# Patient Record
Sex: Female | Born: 1965 | Race: White | Hispanic: No | Marital: Married | Smoking: Never smoker
Health system: Southern US, Community
[De-identification: ages and names within clinical notes are randomized; demographics above are authoritative.]

## PROBLEM LIST (undated history)

## (undated) DIAGNOSIS — R635 Abnormal weight gain: Secondary | ICD-10-CM

## (undated) DIAGNOSIS — F419 Anxiety disorder, unspecified: Secondary | ICD-10-CM

## (undated) DIAGNOSIS — K219 Gastro-esophageal reflux disease without esophagitis: Secondary | ICD-10-CM

## (undated) HISTORY — DX: Abnormal weight gain: R63.5

## (undated) HISTORY — DX: Gastro-esophageal reflux disease without esophagitis: K21.9

## (undated) HISTORY — PX: COLONOSCOPY: SHX174

## (undated) HISTORY — DX: Anxiety disorder, unspecified: F41.9

## (undated) HISTORY — PX: BLADDER SURGERY: SHX569

---

## 2005-03-19 ENCOUNTER — Ambulatory Visit: Payer: Self-pay | Admitting: Unknown Physician Specialty

## 2006-08-26 ENCOUNTER — Ambulatory Visit: Payer: Self-pay | Admitting: Unknown Physician Specialty

## 2006-09-01 ENCOUNTER — Encounter: Payer: Self-pay | Admitting: Urology

## 2006-09-11 ENCOUNTER — Encounter: Payer: Self-pay | Admitting: Urology

## 2006-10-12 ENCOUNTER — Encounter: Payer: Self-pay | Admitting: Urology

## 2006-11-11 ENCOUNTER — Encounter: Payer: Self-pay | Admitting: Urology

## 2007-09-27 ENCOUNTER — Ambulatory Visit: Payer: Self-pay | Admitting: Unknown Physician Specialty

## 2009-10-03 ENCOUNTER — Ambulatory Visit: Payer: Self-pay | Admitting: Unknown Physician Specialty

## 2010-07-25 ENCOUNTER — Emergency Department: Payer: Self-pay | Admitting: Unknown Physician Specialty

## 2016-12-02 ENCOUNTER — Telehealth: Payer: Self-pay | Admitting: Gastroenterology

## 2016-12-02 NOTE — Telephone Encounter (Signed)
Patient left a voice message that she had a colonoscopy 3 years ago with Dr. Servando SnareWohl and remembered him saying she will need a repeat in 3 years due to family history. Please double check and call patient.

## 2016-12-10 ENCOUNTER — Encounter: Payer: Self-pay | Admitting: Gastroenterology

## 2016-12-10 NOTE — Telephone Encounter (Signed)
Contacted pt and advised her that she was due for her repeat colonoscopy, but not until 04/2017. Also advised her Dr. Servando SnareWohl no longer goes to [facility] so she may want to check with her insurance and see if those restrictions to go to [facility] still apply. If so, she will need to contact her PCP for a new referral to another GI.

## 2017-08-26 ENCOUNTER — Encounter: Payer: Self-pay | Admitting: Obstetrics & Gynecology

## 2017-08-27 ENCOUNTER — Encounter: Payer: Self-pay | Admitting: Obstetrics & Gynecology

## 2017-08-27 ENCOUNTER — Ambulatory Visit (INDEPENDENT_AMBULATORY_CARE_PROVIDER_SITE_OTHER): Payer: BC Managed Care – PPO | Admitting: Obstetrics & Gynecology

## 2017-08-27 VITALS — BP 130/90 | HR 67 | Ht 67.0 in | Wt 200.0 lb

## 2017-08-27 DIAGNOSIS — Z131 Encounter for screening for diabetes mellitus: Secondary | ICD-10-CM

## 2017-08-27 DIAGNOSIS — Z1231 Encounter for screening mammogram for malignant neoplasm of breast: Secondary | ICD-10-CM | POA: Diagnosis not present

## 2017-08-27 DIAGNOSIS — Z1239 Encounter for other screening for malignant neoplasm of breast: Secondary | ICD-10-CM

## 2017-08-27 DIAGNOSIS — Z1329 Encounter for screening for other suspected endocrine disorder: Secondary | ICD-10-CM | POA: Diagnosis not present

## 2017-08-27 DIAGNOSIS — Z0000 Encounter for general adult medical examination without abnormal findings: Secondary | ICD-10-CM | POA: Diagnosis not present

## 2017-08-27 DIAGNOSIS — Z13220 Encounter for screening for lipoid disorders: Secondary | ICD-10-CM | POA: Diagnosis not present

## 2017-08-27 DIAGNOSIS — Z1321 Encounter for screening for nutritional disorder: Secondary | ICD-10-CM | POA: Diagnosis not present

## 2017-08-27 MED ORDER — PHENTERMINE HCL 37.5 MG PO TABS: 37.5000 mg | ORAL_TABLET | Freq: Every day | ORAL | 0 refills | Status: DC

## 2017-08-27 NOTE — Progress Notes (Signed)
HPI:      Ms. Lauren Harris is a 52 y.o. [id number] who LMP was in the past, she presents today for her annual examination.  The patient has no complaints today. The patient is sexually active. Herlast pap: approximate date 2017 and was normal and last mammogram: approximate date 2017 and was normal.  The patient does perform self breast exams.  There is no notable family history of breast or ovarian cancer in her family. The patient is not taking hormone replacement therapy. Patient denies post-menopausal vaginal bleeding.   The patient has regular exercise: yes. The patient denies current symptoms of depression.    GYN Hx: Last Colonoscopy:few months ago. Normal.  Last DEXA: never ago.    PMHx: Past Medical History:  Diagnosis Date  . Anxiety   . GERD (gastroesophageal reflux disease)   . Weight gain    Past Surgical History:  Procedure Laterality Date  . BLADDER SURGERY    . COLONOSCOPY     Family History  Problem Relation Age of Onset  . Leukemia Mother   . Colon cancer Brother   . Cancer Father   . Kidney failure Father    Social History   Tobacco Use  . Smoking status: Never Smoker  . Smokeless tobacco: Never Used  Substance Use Topics  . Alcohol use: No    Frequency: Never  . Drug use: No    Current Outpatient Medications:  .  esomeprazole (NEXIUM) 20 MG capsule, Take by mouth., Disp: , Rfl:  .  ibuprofen (ADVIL,MOTRIN) 200 MG tablet, Take by mouth., Disp: , Rfl:  .  phentermine (ADIPEX-P) 37.5 MG tablet, Take 1 tablet (37.5 mg total) by mouth daily before breakfast., Disp: 30 tablet, Rfl: 0 .  RABEprazole (ACIPHEX) 20 MG tablet, TK 1 T PO QD, Disp: , Rfl: 0 .  sertraline (ZOLOFT) 100 MG tablet, TK 1 T PO AS NEEDED, Disp: , Rfl: 3 Allergies: Codeine and Penicillin g  Review of Systems  Constitutional: Negative for chills, fever and malaise/fatigue.  HENT: Negative for congestion, sinus pain and sore throat.   Eyes: Negative for blurred vision and pain.    Respiratory: Negative for cough and wheezing.   Cardiovascular: Negative for chest pain and leg swelling.  Gastrointestinal: Negative for abdominal pain, constipation, diarrhea, heartburn, nausea and vomiting.  Genitourinary: Negative for dysuria, frequency, hematuria and urgency.  Musculoskeletal: Negative for back pain, joint pain, myalgias and neck pain.  Skin: Negative for itching and rash.  Neurological: Negative for dizziness, tremors and weakness.  Endo/Heme/Allergies: Does not bruise/bleed easily.  Psychiatric/Behavioral: Negative for depression. The patient is not nervous/anxious and does not have insomnia.     Objective: BP 130/90   Pulse 67   Ht 5\' 7"  (1.702 m)   Wt 200 lb (90.7 kg)   BMI 31.32 kg/m   Filed Weights   08/27/17 1435  Weight: 200 lb (90.7 kg)   Body mass index is 31.32 kg/m. Physical Exam  Constitutional: She is oriented to person, place, and time. She appears well-developed and well-nourished. No distress.  Genitourinary: Rectum normal, vagina normal and uterus normal. Pelvic exam was performed with patient supine. There is no rash or lesion on the right labia. There is no rash or lesion on the left labia. Vagina exhibits no lesion. No bleeding in the vagina. Right adnexum does not display mass and does not display tenderness. Left adnexum does not display mass and does not display tenderness. Cervix does not exhibit motion tenderness, lesion,  friability or polyp.   Uterus is mobile and midaxial. Uterus is not enlarged or exhibiting a mass.  HENT:  Head: Normocephalic and atraumatic. Head is without laceration.  Right Ear: Hearing normal.  Left Ear: Hearing normal.  Nose: No epistaxis.  No foreign bodies.  Mouth/Throat: Uvula is midline, oropharynx is clear and moist and mucous membranes are normal.  Eyes: Pupils are equal, round, and reactive to light.  Neck: Normal range of motion. Neck supple. No thyromegaly present.  Cardiovascular: Normal rate and  regular rhythm. Exam reveals no gallop and no friction rub.  No murmur heard. Pulmonary/Chest: Effort normal and breath sounds normal. No respiratory distress. She has no wheezes. Right breast exhibits no mass, no skin change and no tenderness. Left breast exhibits no mass, no skin change and no tenderness.  Abdominal: Soft. Bowel sounds are normal. She exhibits no distension. There is no tenderness. There is no rebound.  Musculoskeletal: Normal range of motion.  Neurological: She is alert and oriented to person, place, and time. No cranial nerve deficit.  Skin: Skin is warm and dry.  Psychiatric: She has a normal mood and affect. Judgment normal.  Vitals reviewed.   Assessment: Annual Exam 1. Annual physical exam   2. Screening for breast cancer   3. Screening for cholesterol level   4. Screening for thyroid disorder   5. Screening for diabetes mellitus   6. Encounter for vitamin deficiency screening     Plan:            1.  Cervical Screening-  Pap smear schedule reviewed with patient  2. Breast screening- Exam annually and mammogram scheduled  3. Colonoscopy every 5 years, Hemoccult testing after age 54  4. Labs To return fasting at a later date  5. Counseling for hormonal therapy: none  6. Weight gain. Obesity  BMI 31. Lifestyle measures and meds discussed.     F/U  Return in about 4 weeks (around 09/24/2017) for Follow up.  Annamarie MajorPaul Shyla Gayheart, MD, Merlinda FrederickFACOG [facility], [facility] 08/27/2017  3:09 PM

## 2017-08-27 NOTE — Patient Instructions (Signed)

## 2017-09-23 ENCOUNTER — Ambulatory Visit (INDEPENDENT_AMBULATORY_CARE_PROVIDER_SITE_OTHER): Payer: BC Managed Care – PPO | Admitting: Obstetrics & Gynecology

## 2017-09-23 ENCOUNTER — Encounter: Payer: Self-pay | Admitting: Obstetrics & Gynecology

## 2017-09-23 VITALS — BP 130/90 | HR 86 | Ht 67.0 in | Wt 196.0 lb

## 2017-09-23 DIAGNOSIS — E669 Obesity, unspecified: Secondary | ICD-10-CM | POA: Diagnosis not present

## 2017-09-23 MED ORDER — PHENTERMINE HCL 37.5 MG PO TABS: 37.5000 mg | ORAL_TABLET | Freq: Every day | ORAL | 1 refills | Status: AC

## 2017-09-23 NOTE — Progress Notes (Signed)
  History of Present Illness:  Lauren Harris is a 52 y.o. who was started on  Phentermine approximately 4 weeks ago. Since that time, she states that her symptoms are improving with min SE and she is losing weight.  Goal weigth 160.  PMHx: She  has a past medical history of Anxiety, GERD (gastroesophageal reflux disease), and Weight gain. Also,  has a past surgical history that includes Bladder surgery and Colonoscopy., family history includes Cancer in her father; Colon cancer in her brother; Kidney failure in her father; Leukemia in her mother.,  reports that  has never smoked. she has never used smokeless tobacco. She reports that she does not drink alcohol or use drugs. No outpatient medications have been marked as taking for the 09/23/17 encounter (Office Visit) with Nadara MustardHarris, Keian Odriscoll P, MD.  . Also, is allergic to codeine and penicillin g..  Review of Systems  All other systems reviewed and are negative.   Physical Exam:  BP 130/90   Pulse 86   Ht 5\' 7"  (1.702 m)   Wt 196 lb (88.9 kg)   BMI 30.70 kg/m  Body mass index is 30.7 kg/m. Constitutional: Well nourished, well developed female in no acute distress.  Abdomen: diffusely non tender to palpation, non distended, and no masses, hernias Neuro: Grossly intact Psych:  Normal mood and affect.    Assessment:  Problem List Items Addressed This Visit      Other   Obesity (BMI 30.0-34.9) - Primary     Medication treatment is going very well for her weight loss.  Plan: She will undergo no change in her medical therapy.  She was amenable to this plan and we will see her back 2 mos  Annamarie MajorPaul Leanna Hamid, MD, Merlinda FrederickFACOG [facility], [facility] 09/23/2017  4:36 PM

## 2017-11-22 ENCOUNTER — Ambulatory Visit: Payer: BC Managed Care – PPO | Admitting: Obstetrics & Gynecology

## 2017-11-29 ENCOUNTER — Telehealth: Payer: Self-pay

## 2017-11-29 ENCOUNTER — Other Ambulatory Visit: Payer: Self-pay | Admitting: Obstetrics & Gynecology

## 2017-11-29 DIAGNOSIS — Z131 Encounter for screening for diabetes mellitus: Secondary | ICD-10-CM

## 2017-11-29 DIAGNOSIS — Z1329 Encounter for screening for other suspected endocrine disorder: Secondary | ICD-10-CM

## 2017-11-29 NOTE — Telephone Encounter (Signed)
-----   Message from Nadara Mustard, MD sent at 11/29/2017  8:57 AM EDT ----- Regarding: labs Received notice she has not received labs yet as ordered at her Annual. Please check and encourage her to do this, and document conversation.

## 2018-05-06 ENCOUNTER — Other Ambulatory Visit: Payer: Self-pay | Admitting: Obstetrics & Gynecology

## 2018-05-06 DIAGNOSIS — Z1231 Encounter for screening mammogram for malignant neoplasm of breast: Secondary | ICD-10-CM

## 2018-05-17 ENCOUNTER — Inpatient Hospital Stay: Admission: RE | Admit: 2018-05-17 | Payer: Self-pay | Source: Ambulatory Visit

## 2018-05-31 ENCOUNTER — Encounter (INDEPENDENT_AMBULATORY_CARE_PROVIDER_SITE_OTHER): Payer: Self-pay

## 2018-05-31 ENCOUNTER — Ambulatory Visit
Admission: RE | Admit: 2018-05-31 | Discharge: 2018-05-31 | Disposition: A | Discharge status: Home / Self Care | Payer: BC Managed Care – PPO | Source: Ambulatory Visit | Attending: Obstetrics & Gynecology | Admitting: Obstetrics & Gynecology

## 2018-05-31 DIAGNOSIS — Z1231 Encounter for screening mammogram for malignant neoplasm of breast: Secondary | ICD-10-CM | POA: Insufficient documentation

## 2018-06-08 ENCOUNTER — Encounter: Payer: Self-pay | Admitting: Obstetrics & Gynecology

## 2020-07-26 ENCOUNTER — Ambulatory Visit: Payer: BC Managed Care – PPO

## 2020-07-26 ENCOUNTER — Other Ambulatory Visit: Payer: Self-pay | Admitting: Pharmacist

## 2020-07-26 ENCOUNTER — Other Ambulatory Visit: Payer: Self-pay | Admitting: Pediatrics

## 2020-07-26 ENCOUNTER — Other Ambulatory Visit: Payer: Self-pay

## 2020-07-26 DIAGNOSIS — H532 Diplopia: Secondary | ICD-10-CM

## 2020-07-29 ENCOUNTER — Ambulatory Visit: Payer: BC Managed Care – PPO

## 2020-10-15 ENCOUNTER — Other Ambulatory Visit: Payer: Self-pay | Admitting: Pediatrics

## 2020-10-15 DIAGNOSIS — Z1231 Encounter for screening mammogram for malignant neoplasm of breast: Secondary | ICD-10-CM

## 2020-10-17 ENCOUNTER — Ambulatory Visit
Admission: RE | Admit: 2020-10-17 | Discharge: 2020-10-17 | Disposition: A | Discharge status: Home / Self Care | Payer: BC Managed Care – PPO | Source: Ambulatory Visit | Attending: Pediatrics | Admitting: Pediatrics

## 2020-10-17 ENCOUNTER — Other Ambulatory Visit: Payer: Self-pay

## 2020-10-17 DIAGNOSIS — Z1231 Encounter for screening mammogram for malignant neoplasm of breast: Secondary | ICD-10-CM | POA: Diagnosis present

## 2021-05-21 IMAGING — MG MM DIGITAL SCREENING BILAT W/ TOMO AND CAD
6 of 10 series · 6 of 30 positions shown · non-contrast
Comparison: Previous exam(s).

CLINICAL DATA: Screening.

EXAM:
DIGITAL SCREENING BILATERAL MAMMOGRAM WITH TOMOSYNTHESIS AND CAD
TECHNIQUE: Bilateral screening digital craniocaudal and mediolateral oblique
mammograms were obtained. Bilateral screening digital breast
tomosynthesis was performed. The images were evaluated with
computer-aided detection.

[L MLO synth-2D]
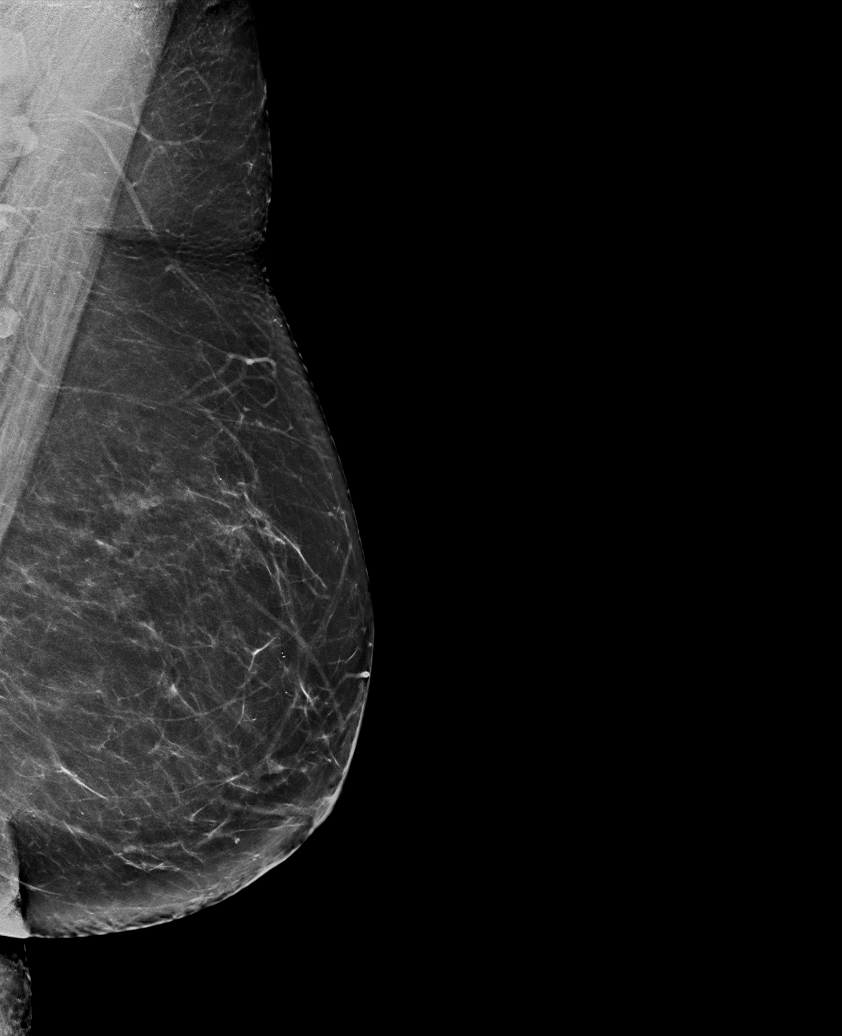

[R MLO synth-2D (1 of 2)]
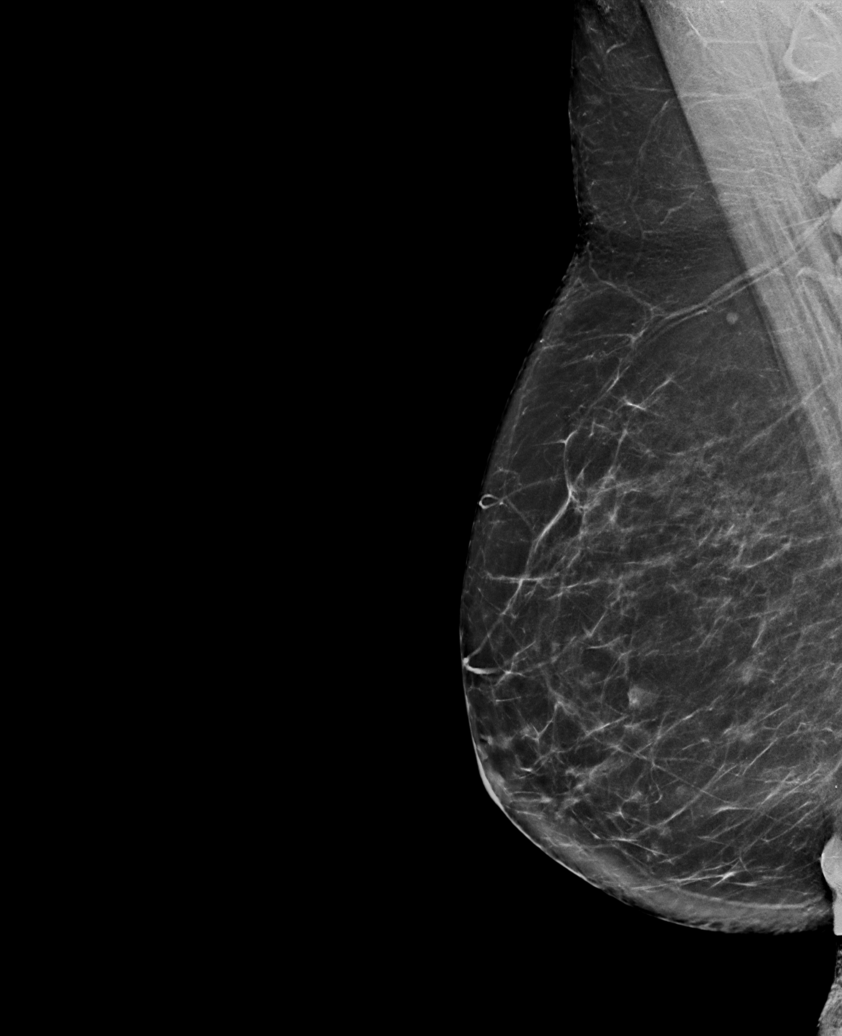

[R CC synth-2D]
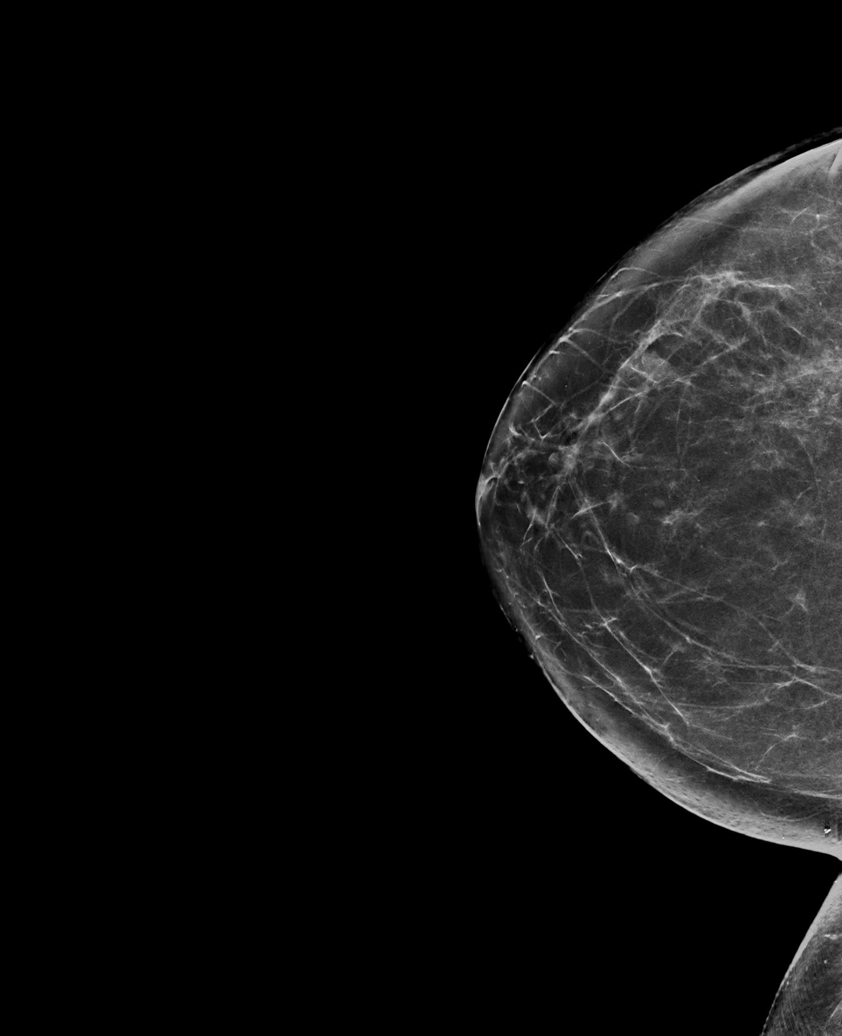

[R MLO synth-2D (2 of 2)]
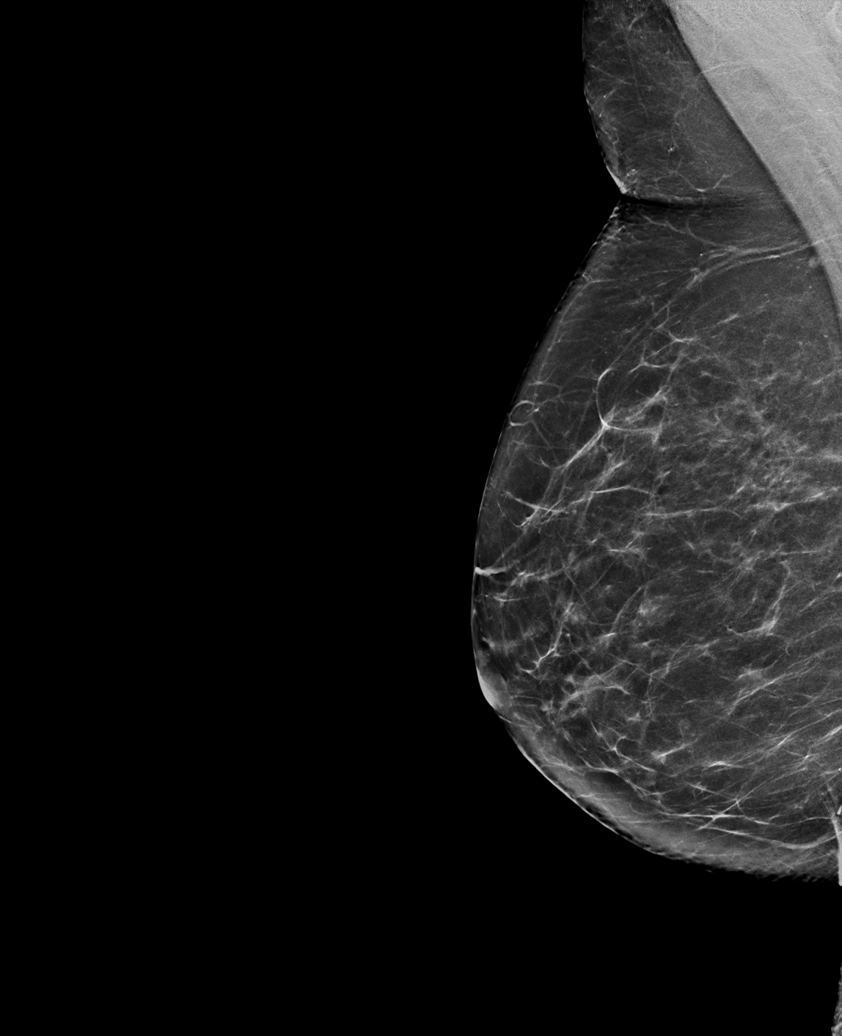

[L CC synth-2D]
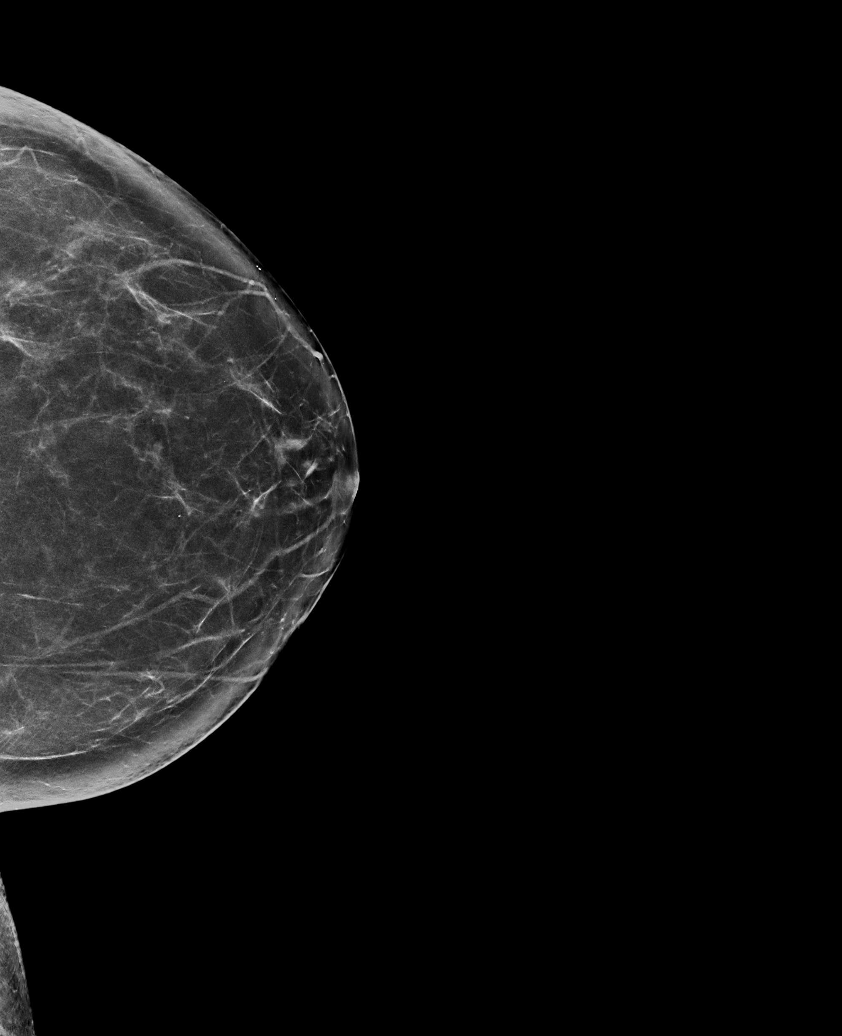

[R CC tomo · tomo slice 37/74.0]
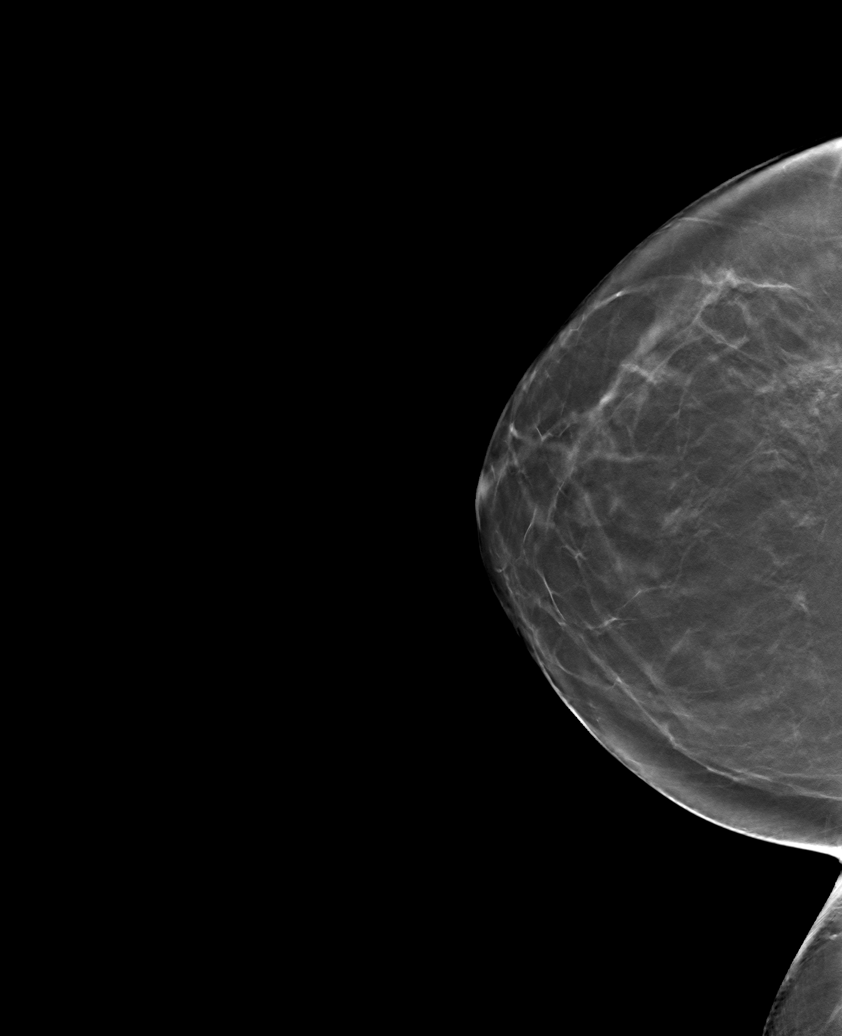

[6 of 30 positions shown; findings below may reference images not displayed]

ACR Breast Density Category b: There are scattered areas of
fibroglandular density.
FINDINGS: There are no findings suspicious for malignancy. The images were
evaluated with computer-aided detection.
IMPRESSION: No mammographic evidence of malignancy. A result letter of this
screening mammogram will be mailed directly to the patient.

RECOMMENDATION:
Screening mammogram in one year. (Code:WJ-[id number])

BI-RADS CATEGORY  1: Negative.

## 2023-08-17 ENCOUNTER — Other Ambulatory Visit: Payer: Self-pay | Admitting: Pediatrics

## 2023-08-17 DIAGNOSIS — E040 Nontoxic diffuse goiter: Secondary | ICD-10-CM

## 2023-08-26 ENCOUNTER — Ambulatory Visit: Payer: 59

## 2023-08-30 ENCOUNTER — Other Ambulatory Visit: Payer: Self-pay | Admitting: Pediatrics

## 2023-08-30 DIAGNOSIS — Z1231 Encounter for screening mammogram for malignant neoplasm of breast: Secondary | ICD-10-CM

## 2023-09-06 ENCOUNTER — Other Ambulatory Visit: Payer: Self-pay | Admitting: Family Medicine

## 2023-09-06 DIAGNOSIS — E040 Nontoxic diffuse goiter: Secondary | ICD-10-CM

## 2023-09-08 ENCOUNTER — Ambulatory Visit
Admission: RE | Admit: 2023-09-08 | Discharge: 2023-09-08 | Disposition: A | Discharge status: Home / Self Care | Payer: 59 | Source: Ambulatory Visit | Attending: Family Medicine | Admitting: Family Medicine

## 2023-09-08 DIAGNOSIS — E040 Nontoxic diffuse goiter: Secondary | ICD-10-CM

## 2023-09-14 ENCOUNTER — Ambulatory Visit
Admission: RE | Admit: 2023-09-14 | Discharge: 2023-09-14 | Disposition: A | Discharge status: Home / Self Care | Payer: 59 | Source: Ambulatory Visit | Attending: Pediatrics | Admitting: Pediatrics

## 2023-09-14 DIAGNOSIS — Z1231 Encounter for screening mammogram for malignant neoplasm of breast: Secondary | ICD-10-CM | POA: Insufficient documentation
